# Patient Record
Sex: Male | Born: 1995 | Race: White | Hispanic: No | Marital: Single | Smoking: Current every day smoker
Health system: Southern US, Community
[De-identification: ages and names within clinical notes are randomized; demographics above are authoritative.]

## PROBLEM LIST (undated history)

## (undated) DIAGNOSIS — Q677 Pectus carinatum: Secondary | ICD-10-CM

## (undated) DIAGNOSIS — Q777 Spondyloepiphyseal dysplasia: Secondary | ICD-10-CM

## (undated) DIAGNOSIS — L709 Acne, unspecified: Secondary | ICD-10-CM

## (undated) DIAGNOSIS — F909 Attention-deficit hyperactivity disorder, unspecified type: Secondary | ICD-10-CM

## (undated) DIAGNOSIS — I499 Cardiac arrhythmia, unspecified: Secondary | ICD-10-CM

## (undated) DIAGNOSIS — R6252 Short stature (child): Secondary | ICD-10-CM

## (undated) DIAGNOSIS — E300 Delayed puberty: Secondary | ICD-10-CM

## (undated) HISTORY — DX: Short stature (child): R62.52

## (undated) HISTORY — PX: APPENDECTOMY: SHX54

## (undated) HISTORY — DX: Pectus carinatum: Q67.7

## (undated) HISTORY — DX: Acne, unspecified: L70.9

## (undated) HISTORY — DX: Delayed puberty: E30.0

## (undated) HISTORY — DX: Spondyloepiphyseal dysplasia: Q77.7

## (undated) HISTORY — DX: Attention-deficit hyperactivity disorder, unspecified type: F90.9

## (undated) HISTORY — DX: Cardiac arrhythmia, unspecified: I49.9

---
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
## 2014-11-25 ENCOUNTER — Emergency Department
Admission: EM | Admit: 2014-11-25 | Discharge: 2014-11-25 | Disposition: A | Discharge status: Home / Self Care | Payer: BLUE CROSS/BLUE SHIELD | Source: Home / Self Care | Attending: Family Medicine | Admitting: Family Medicine

## 2014-11-25 ENCOUNTER — Encounter: Payer: Self-pay | Admitting: Emergency Medicine

## 2014-11-25 DIAGNOSIS — L259 Unspecified contact dermatitis, unspecified cause: Secondary | ICD-10-CM

## 2014-11-25 MED ORDER — PREDNISONE 20 MG PO TABS: ORAL_TABLET | ORAL | Status: DC

## 2014-11-25 MED ORDER — HYDROXYZINE HCL 25 MG PO TABS: 25.0000 mg | ORAL_TABLET | Freq: Four times a day (QID) | ORAL | Status: DC | PRN

## 2014-11-25 NOTE — ED Notes (Signed)
Pt c/o rash on his arms and genital area for 2 days.  Pt has tried Hydrocortisone cream w/o relief.

## 2014-11-25 NOTE — ED Provider Notes (Signed)
CSN: [id number]     Arrival date & time 11/25/14  1134 History   First MD Initiated Contact with Patient 11/25/14 1203     Chief Complaint  Patient presents with  . Rash   (Consider location/radiation/quality/duration/timing/severity/associated sxs/prior Treatment) HPI The patient is a 19 year old male presenting to urgent care with complaint of gradually worsening moderately pruritic erythematous rash on bilateral arms, legs, and then groin area for the last 2 days.  Patient states he does work outside a lot and has had similar rash after exposure with poison ivy.  Patient states he feels this is what caused current rash.  Patient believes he may have had some oil from the plants still on his skin when he used the bathroom which caused a rash in his groin region.  Denies difficulty urinating.  Denies pain to penis or scrotum.  Denies penile discharge.  He has been trying hydrocortisone cream without relief.  Denies fevers, chills, nausea, vomiting or diarrhea.  Denies throat pain, swelling or difficulty breathing.  No known allergies.  No new soaps, medications, foods, or lotions.  No contact with others with similar rash.  No known tick bites.  Past Medical History  Diagnosis Date  . ADHD (attention deficit hyperactivity disorder)   . Pectus carinatum   . 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 Cardiac dysrhythmia, unspecified 04/11/2008  . Acne   . Short stature     Overall Height/Trunk   . 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 Gerlean Ren [facility])    Past Surgical History  Procedure Laterality Date  . Wisdom tooth extraction  2014   Family History  Problem Relation Age of Onset  . Hypertension Mother   . Hyperlipidemia Mother   . Stroke Maternal Grandmother   . Hypertension Maternal Grandmother   . Lung cancer Paternal Grandfather   . Cancer Paternal Grandmother    History  Substance Use Topics  .  Smoking status: Never Smoker   . Smokeless tobacco: Never Used  . Alcohol Use: No    Review of Systems  Constitutional: Negative for fever and chills.  Respiratory: Negative for cough and shortness of breath.   Cardiovascular: Negative for chest pain and palpitations.  Gastrointestinal: Negative for nausea, vomiting, abdominal pain and diarrhea.  Musculoskeletal: Negative for myalgias, back pain and arthralgias.  Skin: Positive for rash. Negative for wound.  All other systems reviewed and are negative.   Allergies  Review of patient's allergies indicates no known allergies.  Home Medications   Prior to Admission medications   Medication Sig Start Date End Date Taking? Authorizing Provider  atomoxetine (STRATTERA) 40 MG capsule Take 1 capsule (40 mg total) by mouth daily. 10/10/13 10/10/14  Gillian Scarce, MD  atomoxetine (STRATTERA) 60 MG capsule Take 1 capsule (60 mg total) by mouth daily. 10/10/13 10/11/14  Gillian Scarce, MD  doxycycline (VIBRAMYCIN) 50 MG capsule Take 2 capsules (100 mg total) by mouth 2 (two) times daily. 10/22/13   Floydene Flock, MD  hydrOXYzine (ATARAX/VISTARIL) 25 MG tablet Take 1 tablet (25 mg total) by mouth every 6 (six) hours as needed for itching. 11/25/14   Junius Finner, PA-C  omega-3 acid ethyl esters (LOVAZA) 1 G capsule Take 4 capsules (4 g total) by mouth 2 (two) times daily. 10/10/13 10/10/14  Gillian Scarce, MD  predniSONE (DELTASONE) 20 MG tablet 3 tabs po day one, then 2 po daily x 4 days 11/25/14  Junius Finner, PA-C  predniSONE (DELTASONE) 50 MG tablet 1 tab daily x 5 days 10/22/13   Floydene Flock, MD   BP 120/72 mmHg  Pulse 71  Temp(Src) 98.3 F (36.8 C) (Oral)  Ht  (1.702 m)  Wt 135 lb (61.236 kg)  BMI 21.14 kg/m2  SpO2 100% Physical Exam  Constitutional: He is oriented to person, place, and time. He appears well-developed and well-nourished.  HENT:  Head: Normocephalic and atraumatic.  Eyes: EOM are normal.  Neck: Normal range of  motion.  Cardiovascular: Normal rate.   Pulmonary/Chest: Effort normal.  Genitourinary: Testes normal and penis normal. Right testis shows no mass, no swelling and no tenderness. Left testis shows no mass, no swelling and no tenderness. Circumcised. No penile erythema or penile tenderness. No discharge found.  Chaperoned exam. Erythematous rash in bilateral groin and over scrotum. No testicular masses or tenderness. No penile discharge or pain. No vesicles.   Musculoskeletal: Normal range of motion.  Neurological: He is alert and oriented to person, place, and time.  Skin: Skin is warm and dry. Rash noted. There is erythema.  Diffuse erythematous patchy rash with linear distribution on arms, legs and groin. Few scattered vesicles on legs and Left forearm. No active bleeding or discharge. No induration or fluctuance. Non-tender   Psychiatric: He has a normal mood and affect. His behavior is normal.  Nursing note and vitals reviewed.   ED Course  Procedures (including critical care time) Labs Review Labs Reviewed - No data to display  Imaging Review No results found.   MDM   1. Contact dermatitis    Patient is a 19 year old male complaining of erythematous pruritic rash after working outside 2 days ago.  Rash consistent with contact dermatitis.  No evidence of underlying infection. Rx: prednisone for 5 days and Atarax for itching.   Home care instructions provided.   Follow-up with PCP in 3-4 days if not improving, sooner if worsening.  Return precautions provided. Pt verbalized understanding and agreement with tx plan.   Junius Finner, PA-C 11/25/14 1316

## 2015-11-15 ENCOUNTER — Emergency Department
Admission: EM | Admit: 2015-11-15 | Discharge: 2015-11-15 | Disposition: A | Discharge status: Home / Self Care | Payer: BLUE CROSS/BLUE SHIELD | Source: Home / Self Care | Attending: Family Medicine | Admitting: Family Medicine

## 2015-11-15 ENCOUNTER — Encounter: Payer: Self-pay | Admitting: Emergency Medicine

## 2015-11-15 DIAGNOSIS — T63441A Toxic effect of venom of bees, accidental (unintentional), initial encounter: Secondary | ICD-10-CM

## 2015-11-15 MED ORDER — TRIAMCINOLONE ACETONIDE 0.1 % EX CREA: 1.0000 "application " | TOPICAL_CREAM | Freq: Two times a day (BID) | CUTANEOUS | Status: DC

## 2015-11-15 NOTE — ED Provider Notes (Signed)
CSN: [id number]     Arrival date & time 11/15/15  1625 History   First MD Initiated Contact with Patient 11/15/15 1701     Chief Complaint  Patient presents with  . Insect Bite   (Consider location/radiation/quality/duration/timing/severity/associated sxs/prior Treatment) HPI  Timothy Gregory is a 20 y.o. male presenting to UC with c/o being stung by yellow jackets 4 times yesterday.  He has mildly pruritic, mildly painful bites to Left upper arm, behind Right knee, Right ankle, and Left thigh.  Denies allergy to bees or wasps. Denies oral swelling or SOB. Denies rashes besides mild redness where he was stung.  He did take Benadryl once today with mild relief of itching. Denies nausea or vomiting. No other symptoms.    Past Medical History  Diagnosis Date  . ADHD (attention deficit hyperactivity disorder)   . Pectus carinatum   . 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 Cardiac dysrhythmia, unspecified 04/11/2008  . Acne   . Short stature     Overall Height/Trunk   . 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 Gerlean RenArthur [facility]ylsworth [facility])    Past Surgical History  Procedure Laterality Date  . Wisdom tooth extraction  2014   Family History  Problem Relation Age of Onset  . Hypertension Mother   . Hyperlipidemia Mother   . Stroke Maternal Grandmother   . Hypertension Maternal Grandmother   . Lung cancer Paternal Grandfather   . Cancer Paternal Grandmother    Social History  Substance Use Topics  . Smoking status: Never Smoker   . Smokeless tobacco: Never Used  . Alcohol Use: No    Review of Systems  Constitutional: Negative for fever and chills.  Respiratory: Negative for cough, chest tightness, shortness of breath, wheezing and stridor.   Gastrointestinal: Negative for nausea and vomiting.  Musculoskeletal: Negative for myalgias, joint swelling and arthralgias.  Skin: Positive for color change and wound (  "stings"). Negative for rash.    Allergies  Review of patient's allergies indicates no known allergies.  Home Medications   Prior to Admission medications   Medication Sig Start Date End Date Taking? Authorizing Provider  atomoxetine (STRATTERA) 40 MG capsule Take 1 capsule (40 mg total) by mouth daily. 10/10/13 10/10/14  Gillian Scarceobyn K Zanard, MD  atomoxetine (STRATTERA) 60 MG capsule Take 1 capsule (60 mg total) by mouth daily. 10/10/13 10/11/14  Gillian Scarceobyn K Zanard, MD  doxycycline (VIBRAMYCIN) 50 MG capsule Take 2 capsules (100 mg total) by mouth 2 (two) times daily. 10/22/13   Floydene FlockSteven J Newton, MD  hydrOXYzine (ATARAX/VISTARIL) 25 MG tablet Take 1 tablet (25 mg total) by mouth every 6 (six) hours as needed for itching. 11/25/14   Junius FinnerErin O'Malley, PA-C  omega-3 acid ethyl esters (LOVAZA) 1 G capsule Take 4 capsules (4 g total) by mouth 2 (two) times daily. 10/10/13 10/10/14  Gillian Scarceobyn K Zanard, MD  predniSONE (DELTASONE) 20 MG tablet 3 tabs po day one, then 2 po daily x 4 days 11/25/14   Junius FinnerErin O'Malley, PA-C  predniSONE (DELTASONE) 50 MG tablet 1 tab daily x 5 days 10/22/13   Floydene FlockSteven J Newton, MD  triamcinolone cream (KENALOG) 0.1 % Apply 1 application topically 2 (two) times daily. 11/15/15   Junius FinnerErin O'Malley, PA-C   Meds Ordered and Administered this Visit  Medications - No data to display  BP 118/76 mmHg  Pulse 68  Temp(Src) 98.4 F (36.9 C) (Oral)  Wt 137 lb (62.143 kg)  SpO2 100% No data found.   Physical Exam  Constitutional: He is oriented to person, place, and time. He appears well-developed and well-nourished.  HENT:  Head: Normocephalic and atraumatic.  No oral swelling  Eyes: EOM are normal.  Neck: Normal range of motion.  Cardiovascular: Normal rate.   Pulmonary/Chest: Effort normal. No stridor. No respiratory distress.  Musculoskeletal: Normal range of motion. He exhibits no edema or tenderness.  Full ROM upper and lower extremities w/o pain.  Neurological: He is alert and oriented to person,  place, and time.  Skin: Skin is warm and dry. There is erythema.  Left anterior shoulder, Right posterior knee, Right posterior ankle, and Left lateral thigh:  2mm punctures c/w sting, mild erythema, 0.5cm area surrounding edema. Mild tenderness. No red streaking.  Psychiatric: He has a normal mood and affect. His behavior is normal.  Nursing note and vitals reviewed.   ED Course  Procedures (including critical care time)  Labs Review Labs Reviewed - No data to display  Imaging Review No results found.    MDM   1. Bee sting, accidental or unintentional, initial encounter    Pt presenting to [facility] with multiple stings from yellow jackets yesterday. No evidence of anaphylaxis. Mild local reaction at sting sites.  Encouraged to keep taking OTC benadryl or may try non-drowsy Claritin or Zytrec (may use generic forms) hydrocortisone cream for itching or may use prescribed Triamcinolone cream. Encouraged to keep sites clean with soap and water. F/u as needed. Pt info packet provided.    Junius Finner, PA-C 11/15/15 [place]

## 2015-11-15 NOTE — Discharge Instructions (Signed)
You can take over the counter Benadryl 29m during the day every 6-8 hours and up to 583mat night to help with itching.  If benadryl makes you too tired during the day, you can try non-drowsy Claritin or Zyrtec (generic forms are okay to use too).  You may try over the counter hydrocortisone cream to help with itching or use the prescribed Triamcinolone cream.  Please be sure to keep bites/stings clean with soap and water. See below for further instructions.   Bee, Wasp, or [employer] Cow[employer]ps, and hornets are part of a family of insects that can sting people. These stings can cause pain and inflammation, but they are usually not serious. However, some people may have an allergic reaction to a sting. This can cause the symptoms to be more severe.  SYMPTOMS  Common symptoms of this condition include:   A red lump in the skin that sometimes has a tiny hole in the center. In some cases, a stinger may be in the center of the wound.  Pain and itching at the sting site.  Redness and swelling around the sting site. If you have an allergic reaction (localized allergic reaction), the swelling and redness may spread out from the sting site. In some cases, this reaction can continue to develop over the next 12-36 hours. In rare cases, a person may have a severe allergic reaction (anaphylactic reaction) to a sting. Symptoms of an anaphylactic reaction may include:   Wheezing or difficulty breathing.  Raised, itchy, red patches on the skin.  Nausea or vomiting.  Abdominal cramping.  Diarrhea.  Chest pain.  Fainting.  Redness of the face (flushing). DIAGNOSIS  This condition is usually diagnosed based on symptoms, medical history, and a physical exam. TREATMENT  Most stings can be treated with:   Icing to reduce swelling.  Medicines (antihistamines) to treat itching or an allergic reaction.  Medicines to help reduce pain. These may be medicines that you take by mouth, or medicated  creams or lotions that you apply to your skin. If you were stung by a bee, the stinger and a small sac of poison may be in the wound. This may be removed by brushing across it with a flat card, such as a credit card. Another method is to pinch the area and pull it out. These methods can help reduce the severity of the body's reaction to the sting.  HOME CARE INSTRUCTIONS   Wash the sting site daily with soap and water as told by your health care provider.  Apply or take over-the-counter and prescription medicines only as told by your health care provider.  If directed, apply ice to the sting area.  Put ice in a plastic bag.  Place a towel between your skin and the bag.  Leave the ice on for 20 minutes, 2-3 times per day.  Do not scratch the sting area.  To lessen pain, try using a paste that is made of water and baking soda. Rub the paste on the sting area and leave it on for 5 minutes.  If you had a severe allergic reaction to a sting, you may need:  To wear a medical bracelet or necklace that lists the allergy.  To learn when and how to use an anaphylaxis kit or epinephrine injection. Your family members may also need to learn this.  To carry an anaphylaxis kit with you at all times. SEEK MEDICAL CARE IF:   Your symptoms do not get better in  2-3 days.  You have redness, swelling, or pain that spreads beyond the area of the sting.  You have a fever. SEEK IMMEDIATE MEDICAL CARE IF:  You have symptoms of a severe allergic reaction. These include:   Wheezing or difficulty breathing.  Chest pain.  Light-headedness or fainting.  Itchy, raised, red patches on the skin.  Nausea or vomiting.  Abdominal cramping.  Diarrhea.   This information is not intended to replace advice given to you by your health care provider. Make sure you discuss any questions you have with your health care provider.   Document Released: 04/14/2005 Document Revised: 01/03/2015 Document  Reviewed: 08/30/2014 Elsevier Interactive Patient Education [employer].

## 2015-11-15 NOTE — ED Notes (Signed)
Pt states he was stung 4 times yesterday by a yellow jacket. No known allergy to bees. Denies SOB. Mild swelling on legs/

## 2015-11-26 ENCOUNTER — Encounter: Payer: Self-pay | Admitting: *Deleted

## 2015-11-26 ENCOUNTER — Emergency Department
Admission: EM | Admit: 2015-11-26 | Discharge: 2015-11-26 | Disposition: A | Discharge status: Home / Self Care | Payer: BLUE CROSS/BLUE SHIELD | Source: Home / Self Care | Attending: Emergency Medicine | Admitting: Emergency Medicine

## 2015-11-26 DIAGNOSIS — H109 Unspecified conjunctivitis: Secondary | ICD-10-CM | POA: Diagnosis not present

## 2015-11-26 MED ORDER — POLYMYXIN B-TRIMETHOPRIM 10000-0.1 UNIT/ML-% OP SOLN: OPHTHALMIC | 0 refills | Status: DC

## 2015-11-26 NOTE — ED Triage Notes (Signed)
Pt c/o LT eye redness x 1800 today. Denies injury.

## 2015-11-26 NOTE — ED Provider Notes (Signed)
[facility]    CSN: [id number] Arrival date & time: 11/26/15  1903  First Provider Contact:  None       History   Chief Complaint Chief Complaint  Patient presents with  . Eye Problem  Left  HPI Killian Doubek is a 20 y.o. male.   HPI L eye redness today. Recalls no injury. Minimal matted discharge. He's rubbed his left eye today and that made it worse. He denies history of seasonal allergies or eye allergies. Denies sneezing or nasal drainage or other ENT symptoms. No fever or chills or nausea or vomiting or chest pain or shortness of breath or cough Past Medical History:  Diagnosis Date  . Acne   . ADHD (attention deficit hyperactivity disorder)   . Cardiac dysrhythmia, unspecified 04/11/2008  . 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 Pectus carinatum   . Short stature    Overall Height/Trunk   . 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 Gerlean Ren [facility])     Patient Active Problem List   Diagnosis Date Noted  . Attention deficit disorder without mention of hyperactivity 12/13/2012  . Other acne 12/13/2012  . Routine infant or child health check 12/13/2012  . Pectus carinatum 12/14/2008  . 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 Cardiac dysrhythmia, unspecified 04/11/2008    Past Surgical History:  Procedure Laterality Date  . WISDOM TOOTH EXTRACTION  2014       Home Medications    Prior to Admission medications   Medication Sig Start Date End Date Taking? Authorizing Provider  atomoxetine (STRATTERA) 40 MG capsule Take 1 capsule (40 mg total) by mouth daily. 10/10/13 10/10/14  Gillian Scarce, MD  atomoxetine (STRATTERA) 60 MG capsule Take 1 capsule (60 mg total) by mouth daily. 10/10/13 10/11/14  Gillian Scarce, MD  trimethoprim-polymyxin b (POLYTRIM) ophthalmic solution 2 drop in affected eye(s) every 4 hours (while awake) x  7 days 11/26/15   Lajean Manes, MD    Family History Family History  Problem Relation Age of Onset  . Hypertension Mother   . Hyperlipidemia Mother   . Stroke Maternal Grandmother   . Hypertension Maternal Grandmother   . Lung cancer Paternal Grandfather   . Cancer Paternal Grandmother     Social History Social History  Substance Use Topics  . Smoking status: Never Smoker  . Smokeless tobacco: Never Used  . Alcohol use No     Allergies   Review of patient's allergies indicates no known allergies.   Review of Systems Review of Systems  All other systems reviewed and are negative.    Physical Exam Triage Vital Signs ED Triage Vitals  Enc Vitals Group     BP 11/26/15 1917 129/74     Pulse Rate 11/26/15 1917 63     Resp 11/26/15 1917 16     Temp 11/26/15 1917 98.2 F (36.8 C)     Temp Source 11/26/15 1917 Oral     SpO2 11/26/15 1917 100 %     Weight 11/26/15 1917 136 lb (61.7 kg)     Height 11/26/15 1917 5\' 6"  (1.676 m)     Head Circumference --      Peak Flow --      Pain Score 11/26/15 1919 0     Pain Loc --      Pain Edu? --      Excl. in GC? --  No data found.   Updated Vital Signs BP 129/74 (BP Location: Left Arm)   Pulse 63   Temp 98.2 F (36.8 C) (Oral)   Resp 16   Ht  (1.676 m)   Wt 136 lb (61.7 kg)   SpO2 100%   BMI 21.95 kg/m   Visual Acuity Right Eye Distance: 20/25 Left Eye Distance: 20/25 Bilateral Distance: 20/25 (w/o correction)  Right Eye Near:   Left Eye Near:    Bilateral Near:     Physical Exam  Constitutional: He is oriented to person, place, and time. He appears well-developed and well-nourished. No distress.  HENT:  Head: Normocephalic and atraumatic.  Eyes: EOM are normal. Pupils are equal, round, and reactive to light. Left eye exhibits discharge (Minimal). Left eye exhibits no exudate and no hordeolum. No foreign body present in the left eye. Right conjunctiva is not injected. Right conjunctiva has no  hemorrhage. Left conjunctiva is injected. Left conjunctiva has no hemorrhage. No scleral icterus. Right eye exhibits normal extraocular motion.  Slit lamp exam:      The right eye shows no corneal ulcer.       The left eye shows no corneal ulcer.  Fundi within normal limits. Anterior chamber clear bilaterally  Neck: Normal range of motion.  Cardiovascular: Normal rate.   Pulmonary/Chest: Effort normal.  Abdominal: He exhibits no distension.  Musculoskeletal: Normal range of motion.  Neurological: He is alert and oriented to person, place, and time.  Skin: Skin is warm.  Psychiatric: He has a normal mood and affect.  Nursing note and vitals reviewed.    UC Treatments / Results  Labs (all labs ordered are listed, but only abnormal results are displayed) Labs Reviewed - No data to display  EKG  EKG Interpretation None       Radiology No results found.  Procedures Procedures (including critical care time)  Medications Ordered in UC Medications - No data to display   Initial Impression / Assessment and Plan / UC Course  I have reviewed the triage vital signs and the nursing notes.  Pertinent labs & imaging results that were available during my care of the patient were reviewed by me and considered in my medical decision making (see chart for details).  Clinical Course    Treatment options discussed, as well as risks, benefits, alternatives. Patient voiced understanding and agreement with the following plans: Polytrim eyedrops prescribed. Warm compresses other measures discussed. Red flags discussed. Follow-up with eye doctor if not improving in 2 days, sooner if worse or new symptoms. He voiced understanding and agreement with above  Final Clinical Impressions(s) / UC Diagnoses   Final diagnoses:  Conjunctivitis, left eye    New Prescriptions Discharge Medication List as of 11/26/2015  7:57 PM    START taking these medications   Details    trimethoprim-polymyxin b (POLYTRIM) ophthalmic solution 2 drop in affected eye(s) every 4 hours (while awake) x 7 days, Print         Lajean Manes, MD 11/26/15 2132

## 2016-04-18 ENCOUNTER — Emergency Department
Admission: EM | Admit: 2016-04-18 | Discharge: 2016-04-18 | Disposition: A | Discharge status: Home / Self Care | Payer: BLUE CROSS/BLUE SHIELD | Source: Home / Self Care | Attending: Family Medicine | Admitting: Family Medicine

## 2016-04-18 ENCOUNTER — Encounter: Payer: Self-pay | Admitting: Emergency Medicine

## 2016-04-18 DIAGNOSIS — J029 Acute pharyngitis, unspecified: Secondary | ICD-10-CM

## 2016-04-18 LAB — POCT RAPID STREP A (OFFICE): RAPID STREP A SCREEN: NEGATIVE

## 2016-04-18 MED ORDER — AZITHROMYCIN 250 MG PO TABS: ORAL_TABLET | ORAL | 0 refills | Status: DC

## 2016-04-18 NOTE — Discharge Instructions (Signed)
As cold symptoms develop, try the following: Take plain guaifenesin (1200mg  extended release tabs such as Mucinex) twice daily, with plenty of water, for cough and congestion.  May add Pseudoephedrine (30mg , one or two every 4 to 6 hours) for sinus congestion.  Get adequate rest.   May use Afrin nasal spray (or generic oxymetazoline) twice daily for about 5 days and then discontinue.  Also recommend using saline nasal spray several times daily and saline nasal irrigation (AYR is a common brand).  Use Flonase nasal spray each morning after using Afrin nasal spray and saline nasal irrigation. Try warm salt water gargles for sore throat.  Stop all antihistamines for now, and other non-prescription cough/cold preparations. May take Ibuprofen 200mg , 4 tabs every 8 hours with food for sore throat, body aches, etc. May take Delsym Cough Suppressant at bedtime for nighttime cough.  Begin Azithromycin if not improving about one week or if persistent fever develops  Follow-up with family doctor if not improving about10 days.

## 2016-04-18 NOTE — ED Provider Notes (Signed)
[facility]    CSN: [id number] Arrival date & time: 04/18/16  1206     History   Chief Complaint Chief Complaint  Patient presents with  . Sore Throat    HPI Timothy Gregory is a 20 y.o. male.   Patient developed intermittent sore throat about one week ago but has not felt ill.  Since then he has developed a mild cough and sinus congestion.  No fevers, chills, and sweats.   The history is provided by the patient.    Past Medical History:  Diagnosis Date  . Acne   . ADHD (attention deficit hyperactivity disorder)   . Cardiac dysrhythmia, unspecified 04/11/2008  . 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 Pectus carinatum   . Short stature    Overall Height/Trunk   . 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 Gerlean RenArthur Aylsworth [facility])     Patient Active Problem List   Diagnosis Date Noted  . Attention deficit disorder without mention of hyperactivity 12/13/2012  . Other acne 12/13/2012  . Routine infant or child health check 12/13/2012  . Pectus carinatum 12/14/2008  . 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 Cardiac dysrhythmia, unspecified 04/11/2008    Past Surgical History:  Procedure Laterality Date  . WISDOM TOOTH EXTRACTION  2014       Home Medications    Prior to Admission medications   Medication Sig Start Date End Date Taking? Authorizing Provider  atomoxetine (STRATTERA) 40 MG capsule Take 1 capsule (40 mg total) by mouth daily. 10/10/13 10/10/14  Gillian Scarceobyn K Zanard, MD  atomoxetine (STRATTERA) 60 MG capsule Take 1 capsule (60 mg total) by mouth daily. 10/10/13 10/11/14  Gillian Scarceobyn K Zanard, MD  azithromycin (ZITHROMAX Z-PAK) 250 MG tablet Take 2 tabs today; then begin one tab once daily for 4 more days. (Rx void after 04/26/16) 04/18/16   Lattie HawStephen A Kable Haywood, MD    Family History Family History  Problem Relation Age of Onset  .  Hypertension Mother   . Hyperlipidemia Mother   . Stroke Maternal Grandmother   . Hypertension Maternal Grandmother   . Lung cancer Paternal Grandfather   . Cancer Paternal Grandmother     Social History Social History  Substance Use Topics  . Smoking status: Never Smoker  . Smokeless tobacco: Never Used  . Alcohol use No     Allergies   Patient has no known allergies.   Review of Systems Review of Systems + sore throat + cough No pleuritic pain No wheezing + nasal congestion + post-nasal drainage No sinus pain/pressure No itchy/red eyes No earache No hemoptysis No SOB No fever/chills No nausea No vomiting No abdominal pain No diarrhea No urinary symptoms No skin rash + fatigue No myalgias No headache Used OTC meds without relief   Physical Exam Triage Vital Signs ED Triage Vitals  Enc Vitals Group     BP 04/18/16 1235 130/88     Pulse Rate 04/18/16 1235 88     Resp --      Temp 04/18/16 1235 98.4 F (36.9 C)     Temp Source 04/18/16 1235 Oral     SpO2 04/18/16 1235 98 %     Weight 04/18/16 1236 145 lb (65.8 kg)     Height --      Head Circumference --      Peak Flow --      Pain Score 04/18/16 1238  0     Pain Loc --      Pain Edu? --      Excl. in GC? --    No data found.   Updated Vital Signs BP 130/88 (BP Location: Right Arm)   Pulse 88   Temp 98.4 F (36.9 C) (Oral)   Wt 145 lb (65.8 kg)   SpO2 98%   BMI 23.40 kg/m   Visual Acuity Right Eye Distance:   Left Eye Distance:   Bilateral Distance:    Right Eye Near:   Left Eye Near:    Bilateral Near:     Physical Exam Nursing notes and Vital Signs reviewed. Appearance:  Patient appears stated age, and in no acute distress Eyes:  Pupils are equal, round, and reactive to light and accomodation.  Extraocular movement is intact.  Conjunctivae are not inflamed  Ears:  Canals normal.  Tympanic membranes normal.  Nose:  Mildly congested turbinates.  No sinus tenderness.   Pharynx:   Normal Neck:  Supple.  Tender enlarged posterior/lateral nodes are palpated bilaterally  Lungs:  Clear to auscultation.  Breath sounds are equal.  Moving air well. Heart:  Regular rate and rhythm without murmurs, rubs, or gallops.  Abdomen:  Nontender without masses or hepatosplenomegaly.  Bowel sounds are present.  No CVA or flank tenderness.  Extremities:  No edema.  Skin:  No rash present.    UC Treatments / Results  Labs (all labs ordered are listed, but only abnormal results are displayed) Labs Reviewed  POCT RAPID STREP A (OFFICE) negative    EKG  EKG Interpretation None       Radiology No results found.  Procedures Procedures (including critical care time)  Medications Ordered in UC Medications - No data to display   Initial Impression / Assessment and Plan / UC Course  I have reviewed the triage vital signs and the nursing notes.  Pertinent labs & imaging results that were available during my care of the patient were reviewed by me and considered in my medical decision making (see chart for details).  Clinical Course   Suspect early viral URI. As cold symptoms develop, try the following: Take plain guaifenesin (1200mg  extended release tabs such as Mucinex) twice daily, with plenty of water, for cough and congestion.  May add Pseudoephedrine (30mg , one or two every 4 to 6 hours) for sinus congestion.  Get adequate rest.   May use Afrin nasal spray (or generic oxymetazoline) twice daily for about 5 days and then discontinue.  Also recommend using saline nasal spray several times daily and saline nasal irrigation (AYR is a common brand).  Use Flonase nasal spray each morning after using Afrin nasal spray and saline nasal irrigation. Try warm salt water gargles for sore throat.  Stop all antihistamines for now, and other non-prescription cough/cold preparations. May take Ibuprofen 200mg , 4 tabs every 8 hours with food for sore throat, body aches, etc. May take Delsym  Cough Suppressant at bedtime for nighttime cough.  Begin Azithromycin if not improving about one week or if persistent fever develops (Given a prescription to hold, with an expiration date)  Follow-up with family doctor if not improving about10 days.      Final Clinical Impressions(s) / UC Diagnoses   Final diagnoses:  Pharyngitis, unspecified etiology    New Prescriptions New Prescriptions   AZITHROMYCIN (ZITHROMAX Z-PAK) 250 MG TABLET    Take 2 tabs today; then begin one tab once daily for 4 more days. (Rx void  after 04/26/16)     Lattie Haw, MD 05/04/16 [phone]

## 2016-04-18 NOTE — ED Triage Notes (Signed)
Pt c/o sore throat x 1 week. Denies fever 

## 2019-06-07 ENCOUNTER — Other Ambulatory Visit: Payer: Self-pay

## 2019-06-07 ENCOUNTER — Emergency Department (INDEPENDENT_AMBULATORY_CARE_PROVIDER_SITE_OTHER): Payer: BC Managed Care – PPO

## 2019-06-07 ENCOUNTER — Emergency Department
Admission: EM | Admit: 2019-06-07 | Discharge: 2019-06-07 | Disposition: A | Discharge status: Home / Self Care | Payer: BC Managed Care – PPO | Source: Home / Self Care | Attending: Family Medicine | Admitting: Family Medicine

## 2019-06-07 ENCOUNTER — Encounter: Payer: Self-pay | Admitting: *Deleted

## 2019-06-07 DIAGNOSIS — R935 Abnormal findings on diagnostic imaging of other abdominal regions, including retroperitoneum: Secondary | ICD-10-CM | POA: Diagnosis not present

## 2019-06-07 DIAGNOSIS — K3580 Unspecified acute appendicitis: Secondary | ICD-10-CM

## 2019-06-07 DIAGNOSIS — K37 Unspecified appendicitis: Secondary | ICD-10-CM | POA: Insufficient documentation

## 2019-06-07 LAB — POCT CBC W AUTO DIFF (K'VILLE URGENT CARE)

## 2019-06-07 LAB — POCT URINALYSIS DIP (MANUAL ENTRY)
Blood, UA: NEGATIVE
Glucose, UA: NEGATIVE mg/dL
Leukocytes, UA: NEGATIVE
Nitrite, UA: NEGATIVE
Protein Ur, POC: 30 mg/dL — AB
Spec Grav, UA: 1.025 (ref 1.010–1.025)
Urobilinogen, UA: 0.2 E.U./dL
pH, UA: 6.5 (ref 5.0–8.0)

## 2019-06-07 MED ORDER — IOHEXOL 300 MG/ML  SOLN
100.0000 mL | Freq: Once | INTRAMUSCULAR | Status: AC | PRN
  Administered 2019-06-07: 100 mL via INTRAVENOUS

## 2019-06-07 MED ORDER — ONDANSETRON HCL 4 MG/2ML IJ SOLN
4.00 | INTRAMUSCULAR | Status: DC
Start: ? — End: 2019-06-07

## 2019-06-07 MED ORDER — LACTATED RINGERS IV SOLN
INTRAVENOUS | Status: DC
Start: ? — End: 2019-06-07

## 2019-06-07 MED ORDER — GENERIC EXTERNAL MEDICATION
3.38 | Status: DC
Start: 2019-06-07 — End: 2019-06-07

## 2019-06-07 MED ORDER — ONDANSETRON 4 MG PO TBDP
4.0000 mg | ORAL_TABLET | Freq: Once | ORAL | Status: AC
  Administered 2019-06-07: 4 mg via ORAL

## 2019-06-07 MED ORDER — FENTANYL CITRATE (PF) 50 MCG/ML IJ SOLN
25.00 | INTRAMUSCULAR | Status: DC
Start: ? — End: 2019-06-07

## 2019-06-07 NOTE — ED Triage Notes (Signed)
Patient c/o nausea and mid abdominal pain x 2 days and vomiting since 1 am this AM. Denies fever.

## 2019-06-07 NOTE — ED Provider Notes (Signed)
[facility]    CSN: [id number] Arrival date & time: 06/07/19  0810      History   Chief Complaint Chief Complaint  Patient presents with  . Abdominal Pain  . Emesis    HPI Timothy Gregory is a 24 y.o. male.   Two nights ago patient developed vague lower abdominal pain/bloating and sensation of hunger, followed by nausea.  The non-radiating pain has persisted, becoming worse last night.  He has been fatigued and developed chills this morning.  He had a normal bowel movement yesterday.  He denies urinary symptoms.  The history is provided by the patient.  Abdominal Pain Pain location:  RLQ Pain quality: bloating, cramping and gnawing   Pain radiates to:  Does not radiate Pain severity:  Moderate Onset quality:  Sudden Duration:  2 days Timing:  Constant Progression:  Worsening Chronicity:  New Context: awakening from sleep   Context: not alcohol use, not diet changes, not eating, not laxative use, not previous surgeries, not recent illness, not recent travel, not sick contacts, not suspicious food intake and not trauma   Relieved by:  Nothing Worsened by:  Palpation Ineffective treatments:  None tried Associated symptoms: anorexia, chills, fatigue, melena, nausea and vomiting   Associated symptoms: no chest pain, no constipation, no cough, no diarrhea, no dysuria, no fever, no flatus, no hematemesis, no hematochezia, no hematuria, no shortness of breath and no sore throat   Emesis Associated symptoms: abdominal pain and chills   Associated symptoms: no cough, no diarrhea, no fever and no sore throat     Past Medical History:  Diagnosis Date  . Acne   . ADHD (attention deficit hyperactivity disorder)   . Cardiac dysrhythmia, unspecified 04/11/2008  . 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 Pectus carinatum   . Short stature    Overall Height/Trunk   . 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 Gerlean Ren [facility])     Patient Active Problem List   Diagnosis Date Noted  . Attention deficit disorder without mention of hyperactivity 12/13/2012  . Other acne 12/13/2012  . Routine infant or child health check 12/13/2012  . Pectus carinatum 12/14/2008  . 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 Cardiac dysrhythmia, unspecified 04/11/2008    Past Surgical History:  Procedure Laterality Date  . WISDOM TOOTH EXTRACTION  2014       Home Medications    Prior to Admission medications   Not on File    Family History Family History  Problem Relation Age of Onset  . Hypertension Mother   . Hyperlipidemia Mother   . Stroke Maternal Grandmother   . Hypertension Maternal Grandmother   . Lung cancer Paternal Grandfather   . Cancer Paternal Grandmother     Social History Social History   Tobacco Use  . Smoking status: Never Smoker  . Smokeless tobacco: Never Used  Substance Use Topics  . Alcohol use: No  . Drug use: No     Allergies   Patient has no known allergies.   Review of Systems Review of Systems  Constitutional: Positive for activity change, appetite change, chills and fatigue. Negative for fever.  HENT: Negative for sore throat.   Eyes: Negative.   Respiratory: Negative for cough and shortness of breath.   Cardiovascular: Negative for chest pain.  Gastrointestinal: Positive for abdominal pain, anorexia, melena, nausea and vomiting. Negative for constipation, diarrhea, flatus,  hematemesis and hematochezia.  Genitourinary: Negative for dysuria, frequency and hematuria.  Musculoskeletal: Negative.   Skin: Negative.   Neurological: Negative.      Physical Exam Triage Vital Signs ED Triage Vitals  Enc Vitals Group     BP 06/07/19 0830 138/87     Pulse Rate 06/07/19 0830 94     Resp 06/07/19 0830 16     Temp 06/07/19 0830 99.1 F (37.3 C)     Temp Source 06/07/19 0830 Oral     SpO2 06/07/19  0830 98 %     Weight 06/07/19 0833 130 lb (59 kg)     Height 06/07/19 0833 5\' 7"  (1.702 m)     Head Circumference --      Peak Flow --      Pain Score 06/07/19 0832 5     Pain Loc --      Pain Edu? --      Excl. in [place]? --    No data found.  Updated Vital Signs BP 138/87 (BP Location: Left Arm)   Pulse 94   Temp 99.1 F (37.3 C) (Oral)   Resp 16   Ht 5\' 7"  (1.702 m)   Wt 59 kg   SpO2 98%   BMI 20.36 kg/m   Visual Acuity Right Eye Distance:   Left Eye Distance:   Bilateral Distance:    Right Eye Near:   Left Eye Near:    Bilateral Near:     Physical Exam Vitals and nursing note reviewed.  Constitutional:      General: He is not in acute distress. HENT:     Head: Normocephalic.     Right Ear: External ear normal.     Left Ear: External ear normal.     Nose: Nose normal.     Mouth/Throat:     Mouth: Mucous membranes are moist.  Eyes:     Pupils: Pupils are equal, round, and reactive to light.  Cardiovascular:     Rate and Rhythm: Normal rate.     Heart sounds: Normal heart sounds.  Pulmonary:     Breath sounds: Normal breath sounds.  Abdominal:     General: Abdomen is flat. Bowel sounds are normal.     Palpations: Abdomen is soft. There is no hepatomegaly or splenomegaly.     Tenderness: There is abdominal tenderness in the right lower quadrant. There is no right CVA tenderness, left CVA tenderness, guarding or rebound. Positive signs include McBurney's sign. Negative signs include psoas sign and obturator sign.    Musculoskeletal:     Cervical back: Neck supple.     Right lower leg: No edema.     Left lower leg: No edema.  Skin:    General: Skin is warm and dry.     Findings: No rash.  Neurological:     Mental Status: He is alert.      UC Treatments / Results  Labs (all labs ordered are listed, but only abnormal results are displayed) Labs Reviewed  POCT URINALYSIS DIP (MANUAL ENTRY) - Abnormal; Notable for the following components:      Result  Value   Color, UA other (*)    Bilirubin, UA small (*)    Ketones, POC UA trace (5) (*)    Protein Ur, POC =30 (*)    All other components within normal limits  POCT CBC W AUTO DIFF ([facility]):  WBC 14.0; LY 9.9; MO 5.1; GR 85.0; Hgb 16.1; Platelets 254  EKG   Radiology CT ABDOMEN PELVIS W CONTRAST  Result Date: 06/07/2019 CLINICAL DATA:  Lower abdominal pain with right-sided abdominal tenderness with chills and nausea. Vomiting. Elevated white blood count. EXAM: CT ABDOMEN AND PELVIS WITH CONTRAST TECHNIQUE: Multidetector CT imaging of the abdomen and pelvis was performed using the standard protocol following bolus administration of intravenous contrast. CONTRAST:  OMNIPAQUE IOHEXOL 300 MG/ML  SOLN COMPARISON:  None. FINDINGS: Lower chest: Normal. Hepatobiliary: No focal liver abnormality is seen. No gallstones, gallbladder wall thickening, or biliary dilatation. Pancreas: Unremarkable. No pancreatic ductal dilatation or surrounding inflammatory changes. Spleen: Normal in size without focal abnormality. Adrenals/Urinary Tract: Adrenal glands are unremarkable. Kidneys are normal, without renal calculi, focal lesion, or hydronephrosis. Bladder is unremarkable. Stomach/Bowel: The patient has acute appendicitis. The appendix is distended and extends low into the right side of the pelvis. There is a 11 mm fecalith base of the appendix as well as more distal appendicoliths. There is abnormal enhancement of the appendix. There is a small rim enhancing fluid collection medial to the base of the appendix seen on image 42 of series 2 and on image 61 of series 6. This could represent a small periappendiceal abscess. Vascular/Lymphatic: No significant vascular findings are present. No enlarged abdominal or pelvic lymph nodes. Reproductive: Prostate is unremarkable. Other: There is a small amount of free fluid in the pelvis adjacent to the rectum on image 58 of series 2. No hernias.  Musculoskeletal: No acute abnormality. 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 IMPRESSION: 1. Acute appendicitis. 2. Small rim enhancing fluid collection medial to the base of the appendix which could represent a small periappendiceal abscess. 3. Small amount of free fluid in the pelvis adjacent to the rectum. 4. Critical Value/emergent results were called by telephone at the time of interpretation on 06/07/2019 at 10:36 am to provider [facility] , who verbally acknowledged these results. Electronically Signed   By: Francene Boyers M.D.   On: 06/07/2019 10:34    Procedures Procedures (including critical care time)  Medications Ordered in UC Medications  ondansetron (ZOFRAN-ODT) disintegrating tablet 4 mg (4 mg Oral Given 06/07/19 0839)    Initial Impression / Assessment and Plan / UC Course  I have reviewed the triage vital signs and the nursing notes.  Pertinent labs & imaging results that were available during my care of the patient were reviewed by me and considered in my medical decision making (see chart for details).    Patient prefers surgery/treatment at [facility].  Patient's vital signs stable; safe to travel by private vehicle (mother to drive).   Final Clinical Impressions(s) / UC Diagnoses   Final diagnoses:  Acute appendicitis, unspecified acute appendicitis type   Discharge Instructions   None    ED Prescriptions    None     PDMP not reviewed this encounter.   Lattie Haw, MD 06/07/19 1110

## 2020-03-09 ENCOUNTER — Emergency Department (INDEPENDENT_AMBULATORY_CARE_PROVIDER_SITE_OTHER)
Admission: EM | Admit: 2020-03-09 | Discharge: 2020-03-09 | Disposition: A | Discharge status: Home / Self Care | Payer: BC Managed Care – PPO | Source: Home / Self Care | Attending: Family Medicine | Admitting: Family Medicine

## 2020-03-09 ENCOUNTER — Other Ambulatory Visit: Payer: Self-pay

## 2020-03-09 ENCOUNTER — Encounter: Payer: Self-pay | Admitting: Emergency Medicine

## 2020-03-09 DIAGNOSIS — J029 Acute pharyngitis, unspecified: Secondary | ICD-10-CM | POA: Diagnosis not present

## 2020-03-09 LAB — POCT RAPID STREP A (OFFICE): Rapid Strep A Screen: NEGATIVE

## 2020-03-09 NOTE — ED Triage Notes (Signed)
Woke up w/ a sore throat this am  History of strep annually Red throat noted - hurts to swallow - denies fever or chills J & J vaccine 3/21

## 2020-03-09 NOTE — Discharge Instructions (Addendum)
Try warm salt water gargles for sore throat.  May take Ibuprofen 200mg , 4 tabs every 8 hours with food.   If cold-like symptoms develop, try the following: Take plain guaifenesin (1200mg  extended release tabs such as Mucinex) twice daily, with plenty of water, for cough and congestion.  May add Pseudoephedrine (30mg , one or two every 4 to 6 hours) for sinus congestion.  Get adequate rest.   May use Afrin nasal spray (or generic oxymetazoline) each morning for about 5 days and then discontinue.  Also recommend using saline nasal spray several times daily and saline nasal irrigation (AYR is a common brand).  Use Flonase nasal spray each morning after using Afrin nasal spray and saline nasal irrigation. May take Delsym Cough Suppressant ("12 Hour Cough Relief") at bedtime for nighttime cough.  Stop all antihistamines for now, and other non-prescription cough/cold preparations.

## 2020-03-09 NOTE — ED Provider Notes (Signed)
[facility]    CSN: [id number] Arrival date & time: 03/09/20  1932      History   Chief Complaint Chief Complaint  Patient presents with  . Sore Throat    HPI Timothy Gregory is a 24 y.o. male.   Patient awoke with sore throat this morning.  He has developed sinus congestion, fatigue, and mild headache.  He denies fever and cough.  The history is provided by the patient.    Past Medical History:  Diagnosis Date  . Acne   . ADHD (attention deficit hyperactivity disorder)   . Cardiac dysrhythmia, unspecified 04/11/2008  . 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 Pectus carinatum   . Short stature    Overall Height/Trunk   . 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 Gerlean Ren [facility] Pediatrics/Genetics)     Patient Active Problem List   Diagnosis Date Noted  . Appendicitis 06/07/2019  . 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 Spondyloepiphyseal dysplasia 10/19/2013  . Attention deficit disorder without mention of hyperactivity 12/13/2012  . Other acne 12/13/2012  . Routine infant or child health check 12/13/2012  . Pectus carinatum 12/14/2008  . 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 Cardiac dysrhythmia, unspecified 04/11/2008    Past Surgical History:  Procedure Laterality Date  . APPENDECTOMY    . WISDOM TOOTH EXTRACTION  2014       Home Medications    Prior to Admission medications   Not on File    Family History Family History  Problem Relation Age of Onset  . Hypertension Mother   . Hyperlipidemia Mother   . Stroke Maternal Grandmother   . Hypertension Maternal Grandmother   . Lung cancer Paternal Grandfather   . Cancer Paternal Grandmother   . Diabetes Father   . Healthy Sister   . Healthy Brother     Social History Social History   Tobacco Use  . Smoking status: Never Smoker  . Smokeless tobacco: Never Used  Substance Use  Topics  . Alcohol use: No  . Drug use: No     Allergies   Patient has no known allergies.   Review of Systems Review of Systems + sore throat No cough No pleuritic pain No wheezing + nasal congestion + post-nasal drainage No sinus pain/pressure No itchy/red eyes No earache No hemoptysis No SOB No fever/chills No nausea No vomiting No abdominal pain No diarrhea No urinary symptoms No skin rash + fatigue No myalgias + headache   Physical Exam Triage Vital Signs ED Triage Vitals  Enc Vitals Group     BP 03/09/20 1946 128/83     Pulse Rate 03/09/20 1946 80     Resp 03/09/20 1946 15     Temp 03/09/20 1946 97.9 F (36.6 C)     Temp Source 03/09/20 1946 Oral     SpO2 03/09/20 1946 97 %     Weight 03/09/20 1952 133 lb (60.3 kg)     Height 03/09/20 1952 5\' 7"  (1.702 m)     Head Circumference --      Peak Flow --      Pain Score --      Pain Loc --      Pain Edu? --      Excl. in [place]? --    No data found.  Updated Vital Signs BP 128/83 (BP Location: Right Arm)   Pulse 80   Temp 97.9  F (36.6 C) (Oral)   Resp 15   Ht 5\' 7"  (1.702 m)   Wt 60.3 kg   SpO2 97%   BMI 20.83 kg/m   Visual Acuity Right Eye Distance:   Left Eye Distance:   Bilateral Distance:    Right Eye Near:   Left Eye Near:    Bilateral Near:     Physical Exam Nursing notes and Vital Signs reviewed. Appearance:  Patient appears stated age, and in no acute distress Eyes:  Pupils are equal, round, and reactive to light and accomodation.  Extraocular movement is intact.  Conjunctivae are not inflamed  Ears:  Canals normal.  Tympanic membranes normal.  Nose:  Mildly congested turbinates.  No sinus tenderness.  Pharynx:  Erythema posteriorly Neck:  Supple.  Mildly enlarged lateral nodes are present, tender to palpation on the left.   Lungs:  Clear to auscultation.  Breath sounds are equal.  Moving air well. Heart:  Regular rate and rhythm without murmurs, rubs, or gallops.  Abdomen:   Nontender without masses or hepatosplenomegaly.  Bowel sounds are present.  No CVA or flank tenderness.  Extremities:  No edema.  Skin:  No rash present.   UC Treatments / Results  Labs (all labs ordered are listed, but only abnormal results are displayed) Labs Reviewed  STREP A DNA PROBE  POCT RAPID STREP A (OFFICE) negative    EKG   Radiology No results found.  Procedures Procedures (including critical care time)  Medications Ordered in UC Medications - No data to display  Initial Impression / Assessment and Plan / UC Course  I have reviewed the triage vital signs and the nursing notes.  Pertinent labs & imaging results that were available during my care of the patient were reviewed by me and considered in my medical decision making (see chart for details).    Suspect early viral URI. Treat symptomatically for now.  Strep A DNA probe pending. Followup with Family Doctor if not improved in about 10 days.   Final Clinical Impressions(s) / UC Diagnoses   Final diagnoses:  Acute pharyngitis, unspecified etiology     Discharge Instructions     Try warm salt water gargles for sore throat.  May take Ibuprofen 200mg , 4 tabs every 8 hours with food.   If cold-like symptoms develop, try the following: Take plain guaifenesin (1200mg  extended release tabs such as Mucinex) twice daily, with plenty of water, for cough and congestion.  May add Pseudoephedrine (30mg , one or two every 4 to 6 hours) for sinus congestion.  Get adequate rest.   May use Afrin nasal spray (or generic oxymetazoline) each morning for about 5 days and then discontinue.  Also recommend using saline nasal spray several times daily and saline nasal irrigation (AYR is a common brand).  Use Flonase nasal spray each morning after using Afrin nasal spray and saline nasal irrigation. May take Delsym Cough Suppressant ("12 Hour Cough Relief") at bedtime for nighttime cough.  Stop all antihistamines for now, and other  non-prescription cough/cold preparations.      ED Prescriptions    None        , MD 03/11/20 2025

## 2020-03-11 LAB — STREP A DNA PROBE: Group A Strep Probe: NOT DETECTED

## 2020-08-16 IMAGING — CT CT ABD-PELV W/ CM
2 of 4 series · 16 of 46 positions shown, 18 images · IV contrast (APPLIED)
Comparison: None.

CLINICAL DATA: Lower abdominal pain with right-sided abdominal
tenderness with chills and nausea. Vomiting. Elevated white blood
count.

EXAM:
CT ABDOMEN AND PELVIS WITH CONTRAST
TECHNIQUE: Multidetector CT imaging of the abdomen and pelvis was performed
using the standard protocol following bolus administration of
intravenous contrast.
CONTRAST:  100mL OMNIPAQUE IOHEXOL 300 MG/ML  SOLN

[Series 2: axial st · axial · 0.80mm/px · z∈[-430,-20]mm · 13 of 90 slices shown, 15 images]
[im 4/90  soft-tissue]
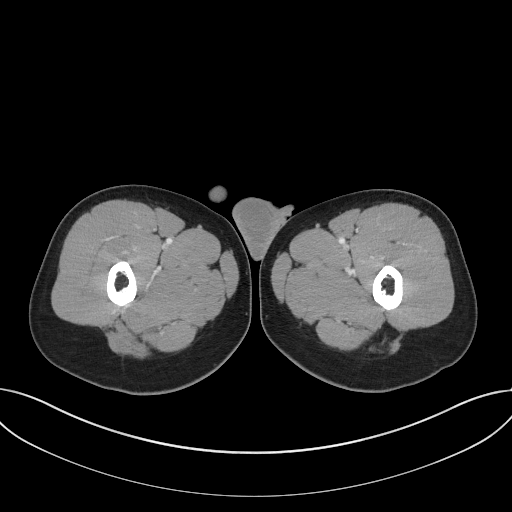
[im 4/90  bone]
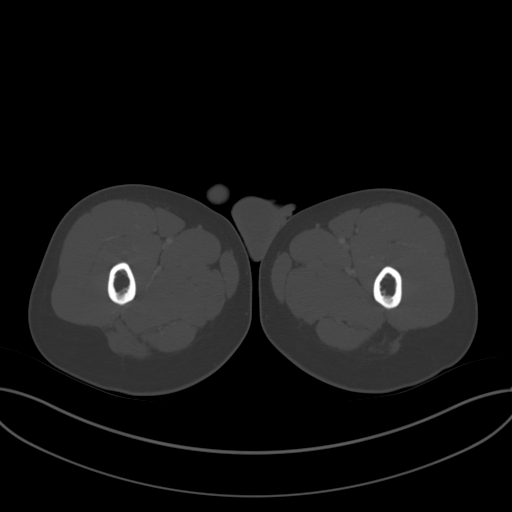
[im 12/90  soft-tissue]
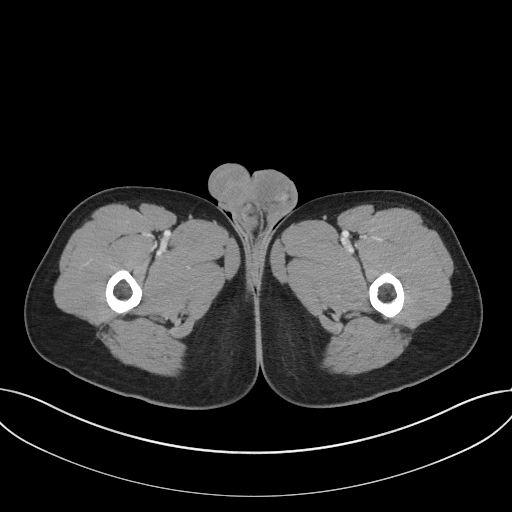
[im 19/90  soft-tissue]
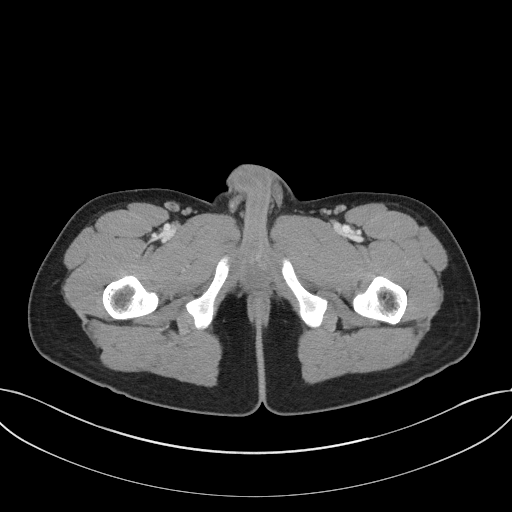
[im 26/90  soft-tissue]
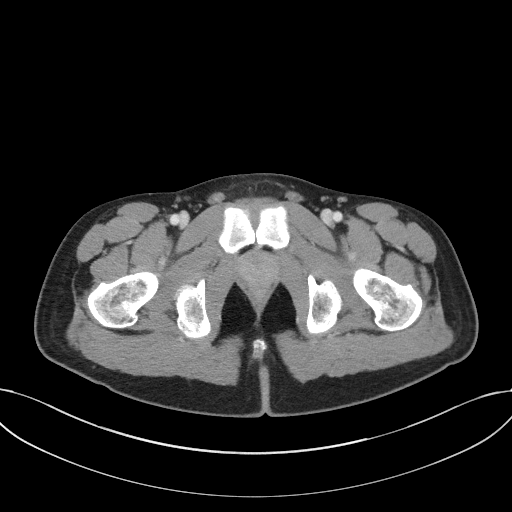
[im 30/90  soft-tissue]
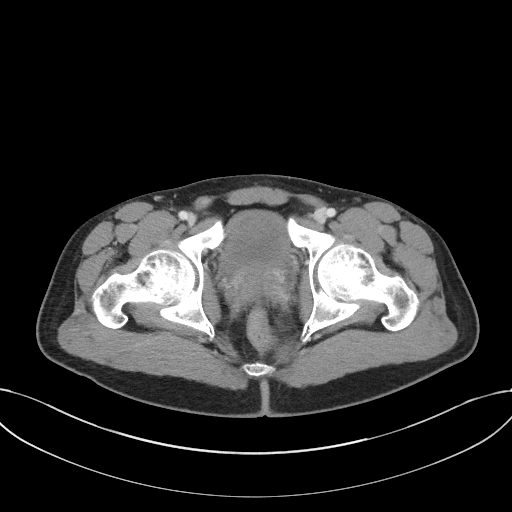
[im 38/90  soft-tissue]
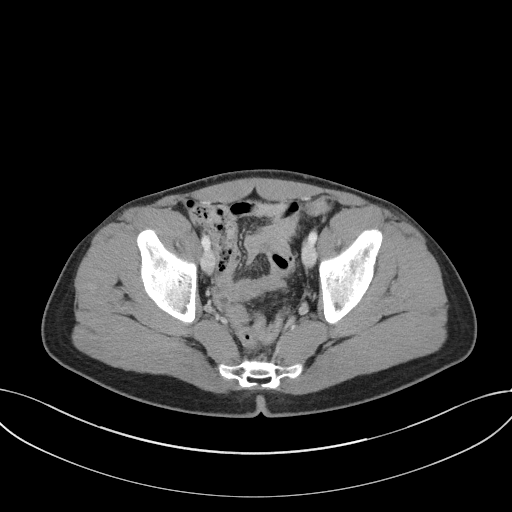
[im 45/90  soft-tissue]
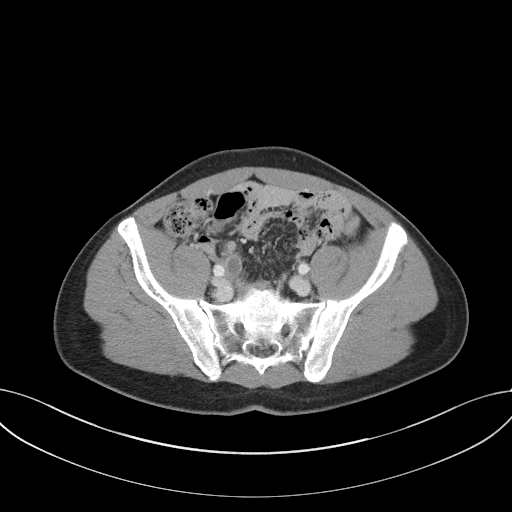
[im 52/90  soft-tissue]
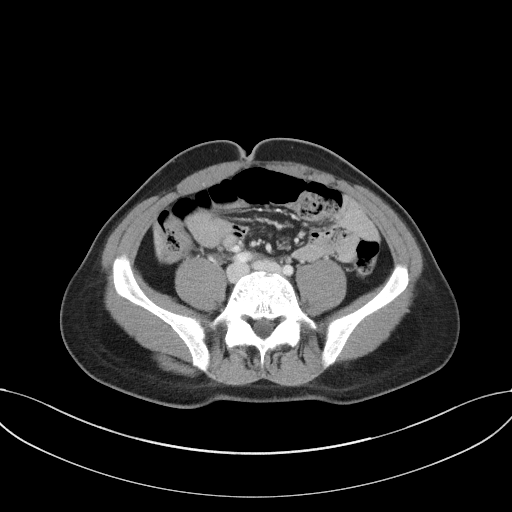
[im 60/90  soft-tissue]
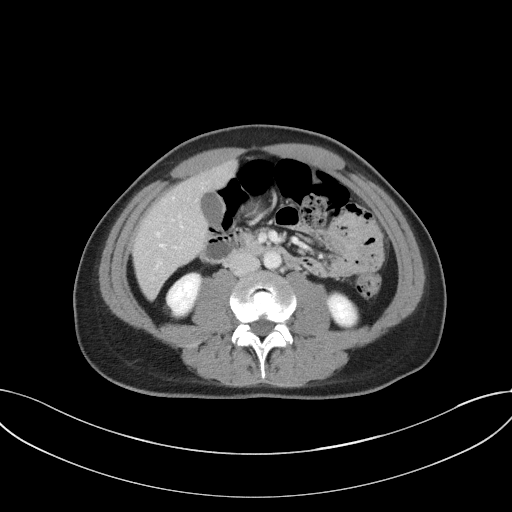
[im 60/90  bone]
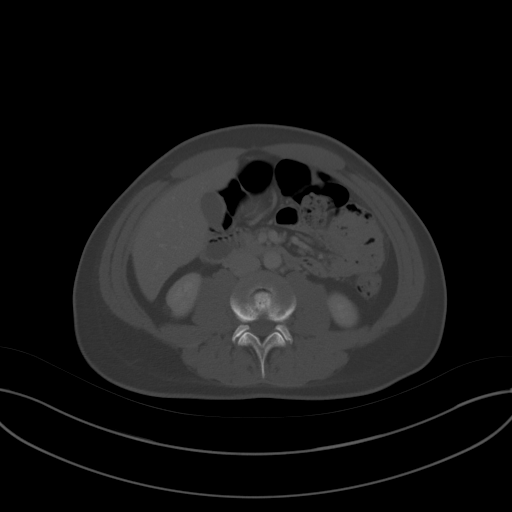
[im 64/90  soft-tissue]
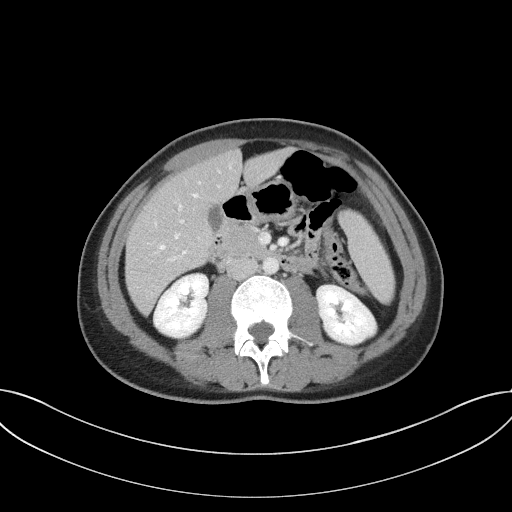
[im 71/90  soft-tissue]
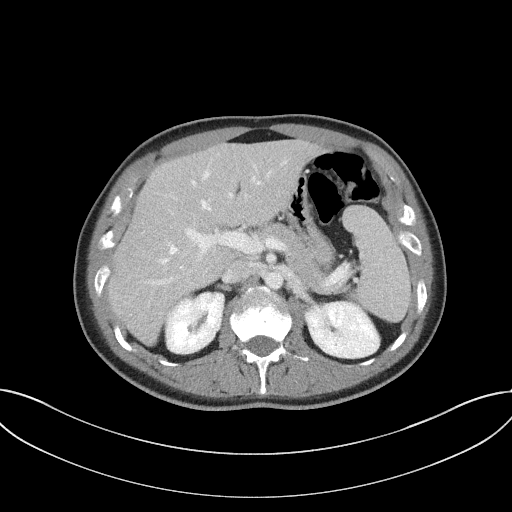
[im 78/90  soft-tissue]
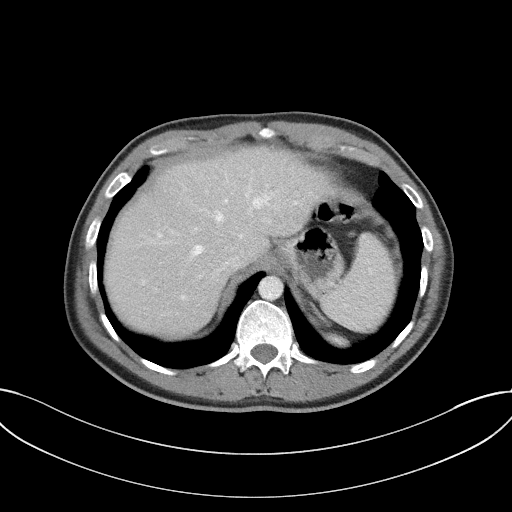
[im 86/90  soft-tissue]
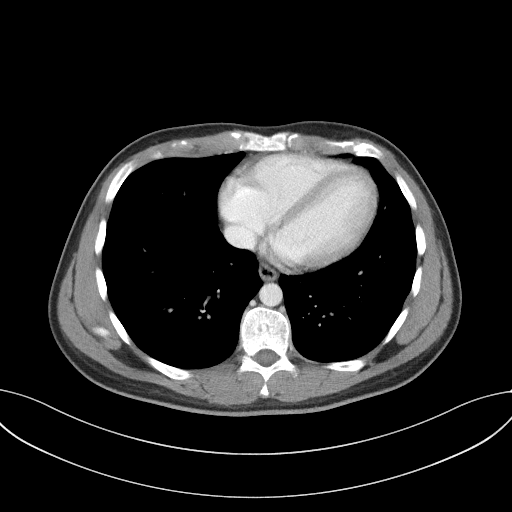

[Series 5: coronal st · coronal · 0.72mm/px · 3 of 83 slices shown]
[im 28/83  soft-tissue]
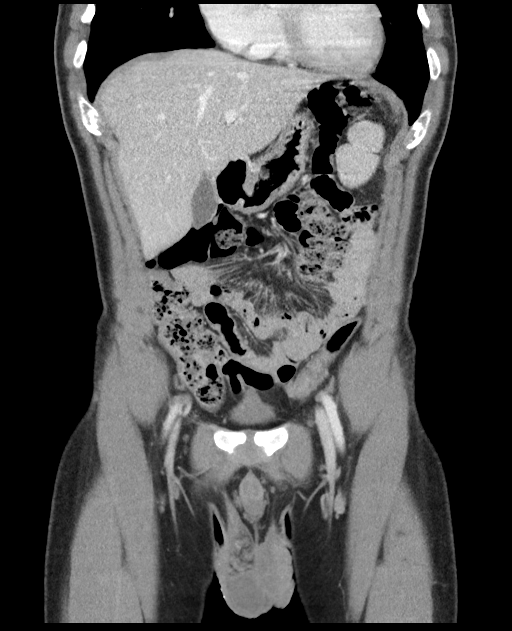
[im 37/83  soft-tissue]
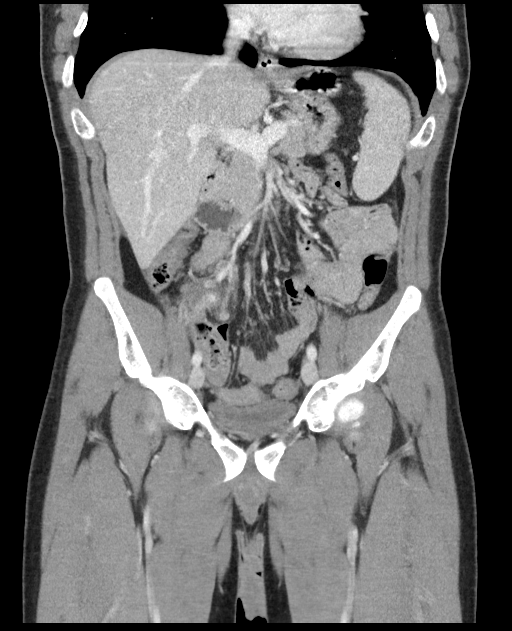
[im 46/83  soft-tissue]
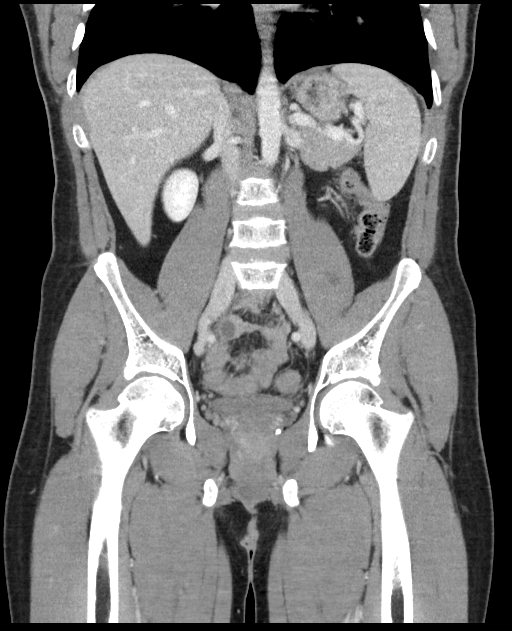

[16 of 46 positions shown; findings below may reference images not displayed]

FINDINGS: Lower chest: Normal.

Hepatobiliary: No focal liver abnormality is seen. No gallstones,
gallbladder wall thickening, or biliary dilatation.

Pancreas: Unremarkable. No pancreatic ductal dilatation or
surrounding inflammatory changes.

Spleen: Normal in size without focal abnormality.

Adrenals/Urinary Tract: Adrenal glands are unremarkable. Kidneys are
normal, without renal calculi, focal lesion, or hydronephrosis.
Bladder is unremarkable.

Stomach/Bowel: The patient has acute appendicitis. The appendix is
distended and extends low into the right side of the pelvis. There
is a 11 mm fecalith base of the appendix as well as more distal
appendicoliths. There is abnormal enhancement of the appendix. There
is a small rim enhancing fluid collection medial to the base of the
appendix seen on image 42 of series 2 and on image 61 of series 6.
This could represent a small periappendiceal abscess.

Vascular/Lymphatic: No significant vascular findings are present. No
enlarged abdominal or pelvic lymph nodes.

Reproductive: Prostate is unremarkable.

Other: There is a small amount of free fluid in the pelvis adjacent
to the rectum on image 58 of series 2. No hernias.

Musculoskeletal: No acute abnormality. Diffuse degenerative changes
in the lumbar spine with chronic deformities of the vertebra
consistent with Scheuermann's disease.
IMPRESSION: 1. Acute appendicitis.
2. Small rim enhancing fluid collection medial to the base of the
appendix which could represent a small periappendiceal abscess.
3. Small amount of free fluid in the pelvis adjacent to the rectum.
4. Critical Value/emergent results were called by telephone at the
[facility] , who verbally acknowledged these results.

## 2021-07-11 ENCOUNTER — Emergency Department (INDEPENDENT_AMBULATORY_CARE_PROVIDER_SITE_OTHER)
Admission: EM | Admit: 2021-07-11 | Discharge: 2021-07-11 | Disposition: A | Discharge status: Home / Self Care | Payer: Self-pay | Source: Home / Self Care

## 2021-07-11 ENCOUNTER — Other Ambulatory Visit: Payer: Self-pay

## 2021-07-11 MED ORDER — MOXIFLOXACIN HCL 0.5 % OP SOLN: 1.0000 [drp] | Freq: Three times a day (TID) | OPHTHALMIC | 0 refills | Status: AC

## 2021-07-11 MED ORDER — FEXOFENADINE HCL 180 MG PO TABS: 180.0000 mg | ORAL_TABLET | Freq: Every day | ORAL | 0 refills | Status: DC

## 2021-07-11 MED ORDER — PREDNISONE 50 MG PO TABS: ORAL_TABLET | ORAL | 0 refills | Status: DC

## 2021-07-11 NOTE — ED Triage Notes (Signed)
Pt presents to Urgent Care with c/o L eye drainage, swelling, and pink discoloration since this AM. Also states his throat hurt a little this AM, but does not hurt currently. Reports nasal congestion in the the mornings recently.  ?

## 2021-07-11 NOTE — Discharge Instructions (Addendum)
Advised/instructed patient to instill eyedrops as directed.  Advised patient if right eye becomes symptomatic may treat right eye as well.  Advised patient to take Allegra and prednisone together for the next 5 days.  Advised may use Allegra as needed afterwards for concurrent postnasal drainage/drip.  Encouraged patient to increase daily water intake while taking these medications.  Advised patient if symptoms worsen and/or unresolved please follow-up with ophthalmology/optometry or here for further evaluation. ?

## 2021-07-11 NOTE — ED Provider Notes (Signed)
?[facility] ? ? ? ?CSN: [id number] ?Arrival date & time: 07/11/21  1056 ? ? ?  ? ?History   ?Chief Complaint ?Chief Complaint  ?Patient presents with  ? Eye Problem  ? ? ?HPI ?[name] is a 26 y.o. male.  ? ?HPI 26 year old male presents with left eye drainage, swelling and pink discoloration since this morning.  Swelling, patient reports nasal congestion in the morning for the past 2 to 3 days.  PMH significant for ADHD and cardiac dysrhythmia, unspecified. ? ?Past Medical History:  ?Diagnosis Date  ? Acne   ? ADHD (attention deficit hyperactivity disorder)   ? Cardiac dysrhythmia, unspecified 04/11/2008  ? 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 Pectus carinatum   ? Short stature   ? Overall Height/Trunk   ? 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 Dr. Gerlean Ren [facility])   ? ? ?Patient Active Problem List  ? Diagnosis Date Noted  ? Appendicitis 06/07/2019  ? 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 Spondyloepiphyseal dysplasia 10/19/2013  ? Attention deficit disorder without mention of hyperactivity 12/13/2012  ? Other acne 12/13/2012  ? Routine infant or child health check 12/13/2012  ? Pectus carinatum 12/14/2008  ? 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 Cardiac dysrhythmia, unspecified 04/11/2008  ? ? ?Past Surgical History:  ?Procedure Laterality Date  ? APPENDECTOMY    ? WISDOM TOOTH EXTRACTION  2014  ? ? ? ? ? ?Home Medications   ? ?Prior to Admission medications   ?Medication Sig Start Date End Date Taking? Authorizing Provider  ?fexofenadine (ALLEGRA ALLERGY) 180 MG tablet Take 1 tablet (180 mg total) by mouth daily for 15 days. 07/11/21 07/26/21 Yes Trevor Iha, FNP  ?moxifloxacin (VIGAMOX) 0.5 % ophthalmic solution Place 1 drop into the left eye 3 (three) times daily for 7 days. 07/11/21 07/18/21 Yes Trevor Iha, FNP  ?predniSONE (DELTASONE) 50 MG tablet Take 1 tab p.o. daily  for 5 days. 07/11/21  Yes Trevor Iha, FNP  ? ? ?Family History ?Family History  ?Problem Relation Age of Onset  ? Hypertension Mother   ? Hyperlipidemia Mother   ? Stroke Maternal Grandmother   ? Hypertension Maternal Grandmother   ? Lung cancer Paternal Grandfather   ? Cancer Paternal Grandmother   ? Diabetes Father   ? Healthy Sister   ? Healthy Brother   ? ? ?Social History ?Social History  ? ?Tobacco Use  ? Smoking status: Former  ?  Types: Cigarettes  ? Smokeless tobacco: Never  ?Vaping Use  ? Vaping Use: Every day  ?Substance Use Topics  ? Alcohol use: Yes  ?  Alcohol/week: 4.0 standard drinks  ?  Types: 4 Standard drinks or equivalent per week  ? Drug use: Yes  ?  Frequency: 7.0 times per week  ?  Types: Marijuana  ? ? ? ?Allergies   ?Patient has no known allergies. ? ? ?Review of Systems ?Review of Systems  ?HENT:  Positive for congestion.   ?Eyes:  Positive for discharge and redness.  ?All other systems reviewed and are negative. ? ? ?Physical Exam ?Triage Vital Signs ?ED Triage Vitals  ?Enc Vitals Group  ?   BP 07/11/21 1237 126/75  ?   Pulse Rate 07/11/21 1237 73  ?   Resp 07/11/21 1237 20  ?   Temp 07/11/21 1237 98.2 ?F (36.8 ?C)  ?   Temp Source 07/11/21 1237 Oral  ?  SpO2 07/11/21 1237 99 %  ?   Weight 07/11/21 1235 140 lb (63.5 kg)  ?   Height 07/11/21 1235 5\' 7"  (1.702 m)  ?   Head Circumference --   ?   Peak Flow --   ?   Pain Score 07/11/21 1234 0  ?   Pain Loc --   ?   Pain Edu? --   ?   Excl. in GC? --   ? ?No data found. ? ?Updated Vital Signs ?BP 126/75 (BP Location: Left Arm)   Pulse 73   Temp 98.2 ?F (36.8 ?C) (Oral)   Resp 20   Ht 5\' 7"  (1.702 m)   Wt 140 lb (63.5 kg)   SpO2 99%   BMI 21.93 kg/m?  ? ? ?Physical Exam ?Vitals and nursing note reviewed.  ?Constitutional:   ?   Appearance: Normal appearance. He is normal weight.  ?HENT:  ?   Head: Normocephalic and atraumatic.  ?   Right Ear: Tympanic membrane, ear canal and external ear normal.  ?   Left Ear: Tympanic membrane, ear  canal and external ear normal.  ?   Nose: Nose normal.  ?   Mouth/Throat:  ?   Mouth: Mucous membranes are moist.  ?   Pharynx: Oropharynx is clear.  ?   Comments: Moderate amount of clear drainage of posterior oropharynx noted ?Eyes:  ?   Extraocular Movements: Extraocular movements intact.  ?   Conjunctiva/sclera: Conjunctivae normal.  ?   Pupils: Pupils are equal, round, and reactive to light.  ?   Comments: Left eye: Sclera with +3 injection, mucopurulent discharge crusted noted at inner canthus  ?Cardiovascular:  ?   Rate and Rhythm: Normal rate and regular rhythm.  ?   Pulses: Normal pulses.  ?   Heart sounds: Normal heart sounds.  ?Pulmonary:  ?   Effort: Pulmonary effort is normal.  ?   Breath sounds: Normal breath sounds. No wheezing, rhonchi or rales.  ?Musculoskeletal:  ?   Cervical back: Normal range of motion and neck supple.  ?Skin: ?   General: Skin is warm and dry.  ?Neurological:  ?   General: No focal deficit present.  ?   Mental Status: He is alert and oriented to person, place, and time.  ? ? ? ?UC Treatments / Results  ?Labs ?(all labs ordered are listed, but only abnormal results are displayed) ?Labs Reviewed - No data to display ? ?EKG ? ? ?Radiology ?No results found. ? ?Procedures ?Procedures (including critical care time) ? ?Medications Ordered in UC ?Medications - No data to display ? ?Initial Impression / Assessment and Plan / UC Course  ?I have reviewed the triage vital signs and the nursing notes. ? ?Pertinent labs & imaging results that were available during my care of the patient were reviewed by me and considered in my medical decision making (see chart for details). ? ?  ? ?MDM: 1.  Conjunctivitis of left eye, unspecified conjunctivitis type-Rx'd Vigamox; 2.  Swelling of left upper eyelid-Rx'd prednisone; 3.  Allergic rhinitis-Allegra. Advised/instructed patient to instill eyedrops as directed.  Advised patient if right eye becomes symptomatic may treat right eye as well.  Advised  patient to take Allegra and prednisone together for the next 5 days.  Advised may use Allegra as needed afterwards for concurrent postnasal drainage/drip.  Encouraged patient to increase daily water intake while taking these medications.  Advised patient if symptoms worsen and/or unresolved please follow-up with ophthalmology/optometry or here  for further evaluation.  Work note provided prior to discharge.  Patient discharged home, hemodynamically stable. ?Final Clinical Impressions(s) / UC Diagnoses  ? ?Final diagnoses:  ?Conjunctivitis of left eye, unspecified conjunctivitis type  ?Swelling of left upper eyelid  ?Allergic rhinitis, unspecified seasonality, unspecified trigger  ? ? ? ?Discharge Instructions   ? ?  ?Advised/instructed patient to instill eyedrops as directed.  Advised patient if right eye becomes symptomatic may treat right eye as well.  Advised patient to take Allegra and prednisone together for the next 5 days.  Advised may use Allegra as needed afterwards for concurrent postnasal drainage/drip.  Encouraged patient to increase daily water intake while taking these medications.  Advised patient if symptoms worsen and/or unresolved please follow-up with ophthalmology/optometry or here for further evaluation. ? ? ? ? ?ED Prescriptions   ? ? Medication Sig Dispense Auth. Provider  ? predniSONE (DELTASONE) 50 MG tablet Take 1 tab p.o. daily for 5 days. 5 tablet Trevor Ihaagan, Yusuke Beza, FNP  ? fexofenadine (ALLEGRA ALLERGY) 180 MG tablet Take 1 tablet (180 mg total) by mouth daily for 15 days. 15 tablet Trevor Ihaagan, Jovonta Levit, FNP  ? moxifloxacin (VIGAMOX) 0.5 % ophthalmic solution Place 1 drop into the left eye 3 (three) times daily for 7 days. 3 mL Trevor Ihaagan, Rahcel Shutes, FNP  ? ?  ? ?PDMP not reviewed this encounter. ?  ?Trevor IhaRagan, Siraj Dermody, FNP ?07/11/21 1311 ? ?

## 2022-12-15 ENCOUNTER — Encounter: Payer: Self-pay | Admitting: Emergency Medicine

## 2022-12-15 ENCOUNTER — Ambulatory Visit
Admission: EM | Admit: 2022-12-15 | Discharge: 2022-12-15 | Disposition: A | Discharge status: Home / Self Care | Payer: BC Managed Care – PPO | Attending: Family Medicine | Admitting: Family Medicine

## 2022-12-15 DIAGNOSIS — H9202 Otalgia, left ear: Secondary | ICD-10-CM

## 2022-12-15 DIAGNOSIS — S0922XA Traumatic rupture of left ear drum, initial encounter: Secondary | ICD-10-CM

## 2022-12-15 MED ORDER — AMOXICILLIN-POT CLAVULANATE 875-125 MG PO TABS: 1.0000 | ORAL_TABLET | Freq: Two times a day (BID) | ORAL | 0 refills | Status: AC

## 2022-12-15 MED ORDER — IBUPROFEN 800 MG PO TABS: 800.0000 mg | ORAL_TABLET | Freq: Three times a day (TID) | ORAL | 0 refills | Status: AC

## 2022-12-15 NOTE — ED Provider Notes (Signed)
[facility]    CSN: [id number] Arrival date & time: 12/15/22  1522      History   Chief Complaint Chief Complaint  Patient presents with   Otalgia    HPI [facility] is a 26 y.o. male.   HPI Pleasant 27 year old male presents with left ear pain and concerned that he may have perforated his left eardrum while cleaning ear today with Q-tip.  Reports was cleaning with Q-tip when he made a sharp turn and then left ear began to hurt.  Patient reports seeing finger and ear and noticing blood.  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 Gerlean Ren 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 Authorizing Provider  amoxicillin-clavulanate (AUGMENTIN) 875-125 MG tablet Take 1 tablet by mouth 2 (two) times daily for 10 days. 12/15/22 12/25/22 Yes Trevor Iha, FNP  ibuprofen (ADVIL) 800 MG tablet Take 1 tablet (800 mg total) by mouth 3 (three) times daily. 12/15/22  Yes Trevor Iha, FNP    Family History Family History   Problem Relation Age of Onset   Hypertension Mother    Hyperlipidemia Mother    Stroke Maternal Grandmother    Hypertension Maternal Grandmother    Lung cancer Paternal Grandfather    Cancer Paternal Grandmother    Diabetes Father    Healthy Sister    Healthy Brother     Social History Social History   Tobacco Use   Smoking status: Every Day    Types: Cigarettes   Smokeless tobacco: Never  Vaping Use   Vaping status: Every Day  Substance Use Topics   Alcohol use: Yes    Alcohol/week: 4.0 standard drinks of alcohol    Types: 4 Standard drinks or equivalent per week   Drug use: Yes    Frequency: 7.0 times per week    Types: Marijuana     Allergies   Patient has no known allergies.   Review of Systems Review of Systems  HENT:  Positive for ear pain.      Physical Exam Triage Vital Signs ED Triage Vitals  Encounter Vitals Group     BP      Systolic BP Percentile      Diastolic BP Percentile      Pulse      Resp      Temp      Temp src      SpO2      Weight      Height  Head Circumference      Peak Flow      Pain Score      Pain Loc      Pain Education      Exclude from Growth Chart    No data found.  Updated Vital Signs BP (!) 142/88 (BP Location: Left Arm)   Pulse 74   Temp 98.8 F (37.1 C) (Oral)   Resp 16   Ht 5\' 7"  (1.702 m)   Wt 140 lb (63.5 kg)   SpO2 98%   BMI 21.93 kg/m    Physical Exam Vitals and nursing note reviewed.  Constitutional:      Appearance: Normal appearance. He is normal weight.  HENT:     Head: Normocephalic and atraumatic.     Right Ear: Tympanic membrane, ear canal and external ear normal.     Left Ear: External ear normal.     Ears:     Comments: Left EAC: Proximal to TM moderate blood noted unable to visualize left TM    Mouth/Throat:     Mouth: Mucous membranes are moist.     Pharynx: Oropharynx is clear.  Eyes:     Extraocular Movements: Extraocular movements intact.     Conjunctiva/sclera:  Conjunctivae normal.     Pupils: Pupils are equal, round, and reactive to light.  Cardiovascular:     Rate and Rhythm: Normal rate and regular rhythm.     Pulses: Normal pulses.     Heart sounds: Normal heart sounds.  Pulmonary:     Effort: Pulmonary effort is normal.     Breath sounds: Normal breath sounds. No wheezing, rhonchi or rales.  Musculoskeletal:        General: Normal range of motion.     Cervical back: Normal range of motion and neck supple.  Skin:    General: Skin is warm and dry.  Neurological:     General: No focal deficit present.     Mental Status: He is alert and oriented to person, place, and time. Mental status is at baseline.  Psychiatric:        Mood and Affect: Mood normal.        Behavior: Behavior normal.      UC Treatments / Results  Labs (all labs ordered are listed, but only abnormal results are displayed) Labs Reviewed - No data to display  EKG   Radiology No results found.  Procedures Procedures (including critical care time)  Medications Ordered in UC Medications - No data to display  Initial Impression / Assessment and Plan / UC Course  I have reviewed the triage vital signs and the nursing notes.  Pertinent labs & imaging results that were available during my care of the patient were reviewed by me and considered in my medical decision making (see chart for details).     MDM: 1.  Rupture of tympanic membrane, traumatic, left, initial encounter-Rx'd Augmentin 875/125 mg tablet twice daily x 10 days advised ENT evaluation as soon as possible.  Contact information provided with this AVS. 2.  Otalgia of left ear-Rx'd Ibuprofen 800 mg 3 times daily as needed for left ear pain. Advised patient to take medication (Augmentin) as directed with food to completion.  Advised may take Ibuprofen daily or as needed for left ear pain.  Encouraged to increase daily water intake to 64 ounces per day while taking these medications.  Advised patient to  follow-up with [facility] for further evaluation.  Contact information  provided with this AVS today.  Patient discharged home, hemodynamically stable. Final Clinical Impressions(s) / UC Diagnoses   Final diagnoses:  Otalgia of left ear  Rupture of tympanic membrane, traumatic, left, initial encounter     Discharge Instructions      Advised patient to take medication (Augmentin) as directed with food to completion.  Advised may take Ibuprofen daily or as needed for left ear pain.  Encouraged to increase daily water intake to 64 ounces per day while taking these medications.  Advised patient to follow-up with [facility] for further evaluation.  Contact information provided with this AVS today.     ED Prescriptions     Medication Sig Dispense Auth. Provider   ibuprofen (ADVIL) 800 MG tablet Take 1 tablet (800 mg total) by mouth 3 (three) times daily. 30 tablet Trevor Iha, FNP   amoxicillin-clavulanate (AUGMENTIN) 875-125 MG tablet Take 1 tablet by mouth 2 (two) times daily for 10 days. 20 tablet Trevor Iha, FNP      PDMP not reviewed this encounter.   Trevor Iha, FNP 12/15/22 1625

## 2022-12-15 NOTE — Discharge Instructions (Addendum)
Advised patient to take medication (Augmentin) as directed with food to completion.  Advised may take Ibuprofen daily or as needed for left ear pain.  Encouraged to increase daily water intake to 64 ounces per day while taking these medications.  Advised patient to follow-up with [facility] for further evaluation.  Contact information provided with this AVS today.

## 2022-12-15 NOTE — ED Triage Notes (Addendum)
Patient states that he was cleaning his ears out with q-tips and made a sharp "turn" with a qtip and it hurt.  Around noon today he placed his finger in his left ear and there was blood on his finger.  Patient very concerned.

## 2023-12-08 ENCOUNTER — Ambulatory Visit
Admission: EM | Admit: 2023-12-08 | Discharge: 2023-12-08 | Disposition: A | Discharge status: Home / Self Care | Attending: Internal Medicine | Admitting: Internal Medicine

## 2023-12-08 ENCOUNTER — Encounter: Payer: Self-pay | Admitting: Emergency Medicine

## 2023-12-08 DIAGNOSIS — Z113 Encounter for screening for infections with a predominantly sexual mode of transmission: Secondary | ICD-10-CM | POA: Insufficient documentation

## 2023-12-08 NOTE — ED Provider Notes (Signed)
 [name]    CSN: [id number] Arrival date & time: 12/08/23  1708      History   Chief Complaint Chief Complaint  Patient presents with   Exposure to STD    HPI Timothy Gregory is a 28 y.o. male.   28 year old male who presents urgent care requesting screening for STIs.  He denies any symptoms.  He denies dysuria, hematuria, penile pain, testicular pain, abdominal pain, fevers, chills.  He would like to have a screening swab done as well as blood work.  He denies any known exposures.   Exposure to STD Pertinent negatives include no chest pain, no abdominal pain and no shortness of breath.    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 Authorizing Provider  ibuprofen  (ADVIL ) 800 MG tablet Take 1 tablet (800 mg total) by mouth 3 (three) times daily. 12/15/22   Teddy Sharper, FNP    Family History Family History  Problem Relation Age of Onset   Hypertension Mother    Hyperlipidemia Mother    Stroke  Maternal Grandmother    Hypertension Maternal Grandmother    Lung cancer Paternal Grandfather    Cancer Paternal Grandmother    Diabetes Father    Healthy Sister    Healthy Brother     Social History Social History   Tobacco Use   Smoking status: Former    Types: Cigarettes   Smokeless tobacco: Never  Vaping Use   Vaping status: Former  Substance Use Topics   Alcohol use: Yes    Alcohol/week: 4.0 standard drinks of alcohol    Types: 4 Standard drinks or equivalent per week   Drug use: Yes    Frequency: 7.0 times per week    Types: Marijuana     Allergies   Patient has no known allergies.   Review of Systems Review of Systems  Constitutional:  Negative for chills and fever.  HENT:  Negative for ear pain and sore throat.   Eyes:  Negative for pain and visual disturbance.  Respiratory:  Negative for cough and shortness of breath.   Cardiovascular:  Negative for chest pain and palpitations.  Gastrointestinal:  Negative for abdominal pain and vomiting.  Genitourinary:  Negative for dysuria and hematuria.  Musculoskeletal:  Negative for arthralgias and back pain.  Skin:  Negative for color change and rash.  Neurological:  Negative for seizures and syncope.  All other systems reviewed and are negative.    Physical Exam Triage Vital  Signs ED Triage Vitals  Encounter Vitals Group     BP 12/08/23 1719 122/82     Girls Systolic BP Percentile --      Girls Diastolic BP Percentile --      Boys Systolic BP Percentile --      Boys Diastolic BP Percentile --      Pulse Rate 12/08/23 1719 79     Resp 12/08/23 1719 18     Temp 12/08/23 1719 99.7 F (37.6 C)     Temp Source 12/08/23 1719 Oral     SpO2 12/08/23 1719 96 %     Weight 12/08/23 1721 140 lb (63.5 kg)     Height 12/08/23 1721 5' 7 (1.702 m)     Head Circumference --      Peak Flow --      Pain Score 12/08/23 1721 0     Pain Loc --      Pain Education --      Exclude from Growth Chart --    No data  found.  Updated Vital Signs BP 122/82 (BP Location: Right Arm)   Pulse 79   Temp 99.7 F (37.6 C) (Oral)   Resp 18   Ht 5' 7 (1.702 m)   Wt 140 lb (63.5 kg)   SpO2 96%   BMI 21.93 kg/m   Visual Acuity Right Eye Distance:   Left Eye Distance:   Bilateral Distance:    Right Eye Near:   Left Eye Near:    Bilateral Near:     Physical Exam Vitals and nursing note reviewed.  Constitutional:      General: He is not in acute distress.    Appearance: He is well-developed.  HENT:     Head: Normocephalic and atraumatic.  Eyes:     Conjunctiva/sclera: Conjunctivae normal.  Cardiovascular:     Rate and Rhythm: Normal rate.  Pulmonary:     Effort: Pulmonary effort is normal. No respiratory distress.  Abdominal:     Palpations: Abdomen is soft.     Tenderness: There is no abdominal tenderness.  Musculoskeletal:        General: No swelling.  Skin:    General: Skin is warm and dry.     Capillary Refill: Capillary refill takes less than 2 seconds.  Neurological:     Mental Status: He is alert.  Psychiatric:        Mood and Affect: Mood normal.      UC Treatments / Results  Labs (all labs ordered are listed, but only abnormal results are displayed) Labs Reviewed  RPR  HIV ANTIBODY (ROUTINE TESTING W REFLEX)  CYTOLOGY, (ORAL, ANAL, URETHRAL) ANCILLARY ONLY    EKG   Radiology No results found.  Procedures Procedures (including critical care time)  Medications Ordered in UC Medications - No data to display  Initial Impression / Assessment and Plan / UC Course  I have reviewed the triage vital signs and the nursing notes.  Pertinent labs & imaging results that were available during my care of the patient were reviewed by me and considered in my medical decision making (see chart for details).     Screening examination for STI - Plan: RPR, HIV Antibody (routine testing w rflx), RPR, HIV Antibody (routine testing w rflx)   Screening swab and bloodwork done  today and results will be available in 24-48 hours. We will contact you if we need to arrange additional treatment based on your testing. Negative results will be on  your MyChart account. Abstain from sex until you receive your final results.  Use a condom for sexual encounters. If you have any symptoms including abnormal discharge, genital pain, abdominal pain, fever, nausea, or vomiting, then you should be reevaluated.    Final Clinical Impressions(s) / UC Diagnoses   Final diagnoses:  Screening examination for STI     Discharge Instructions      Screening swab and bloodwork done today and results will be available in 24-48 hours. We will contact you if we need to arrange additional treatment based on your testing. Negative results will be on your MyChart account. Abstain from sex until you receive your final results.  Use a condom for sexual encounters. If you have any symptoms including abnormal discharge, genital pain, abdominal pain, fever, nausea, or vomiting, then you should be reevaluated.      ED Prescriptions   None    PDMP not reviewed this encounter.   Teresa Almarie LABOR, PA-C 12/08/23 1800

## 2023-12-08 NOTE — Discharge Instructions (Addendum)
 Screening swab and bloodwork done today and results will be available in 24-48 hours. We will contact you if we need to arrange additional treatment based on your testing. Negative results will be on your MyChart account. Abstain from sex until you receive your final results.  Use a condom for sexual encounters. If you have any symptoms including abnormal discharge, genital pain, abdominal pain, fever, nausea, or vomiting, then you should be reevaluated.

## 2023-12-08 NOTE — ED Triage Notes (Signed)
 Patient requesting STI testing including bloodwork.  Denies any sx's.

## 2023-12-09 LAB — RPR: RPR Ser Ql: NONREACTIVE

## 2023-12-09 LAB — HIV ANTIBODY (ROUTINE TESTING W REFLEX): HIV Screen 4th Generation wRfx: NONREACTIVE

## 2023-12-10 LAB — CYTOLOGY, (ORAL, ANAL, URETHRAL) ANCILLARY ONLY
Chlamydia: NEGATIVE
Comment: NEGATIVE
Comment: NEGATIVE
Comment: NORMAL
Neisseria Gonorrhea: NEGATIVE
Trichomonas: NEGATIVE
